# Patient Record
Sex: Female | Born: 2005 | Race: White | Hispanic: No | Marital: Single | Smoking: Never smoker
Health system: Southern US, Community
[De-identification: ages and names within clinical notes are randomized; demographics above are authoritative.]

---
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
## 2023-03-09 IMAGING — CT CT ABD-PELV W/ CM
2 of 4 series · 17 of 46 positions shown, 19 images · IV contrast (omnipaque)
Comparison: None.

CLINICAL DATA: Generalized abdominal pain and nausea.

EXAM:
CT ABDOMEN AND PELVIS WITH CONTRAST
TECHNIQUE: Multidetector CT imaging of the abdomen and pelvis was performed
using the standard protocol following bolus administration of
intravenous contrast.
CONTRAST:  100mL OMNIPAQUE IOHEXOL 300 MG/ML  SOLN

[Series 2: axial st · axial · 1.06mm/px · z∈[+1047,+1517]mm · 14 of 104 slices shown, 16 images]
[im 5/104  soft-tissue]
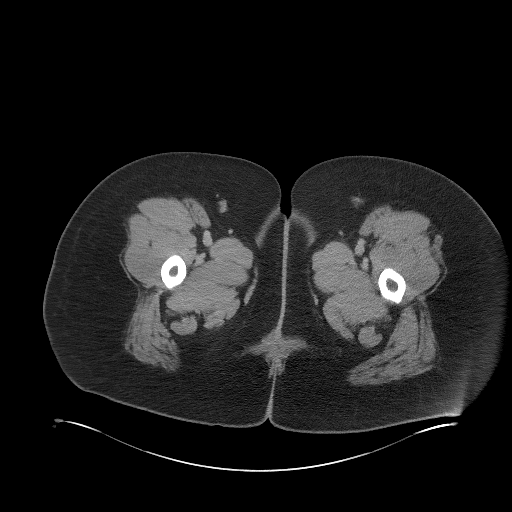
[im 5/104  bone]
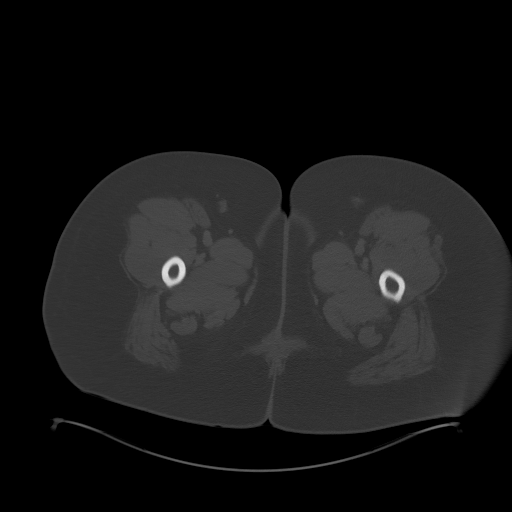
[im 13/104  soft-tissue]
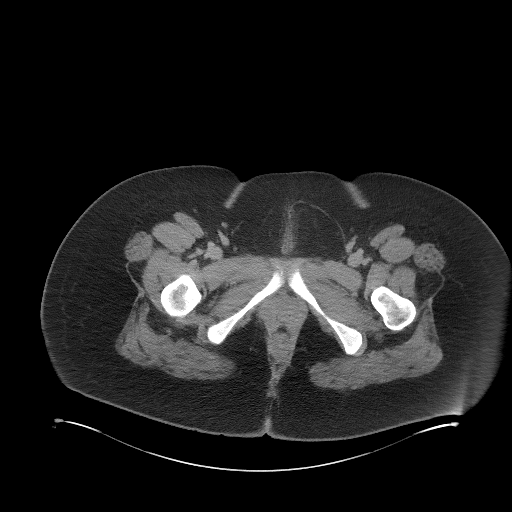
[im 22/104  soft-tissue]
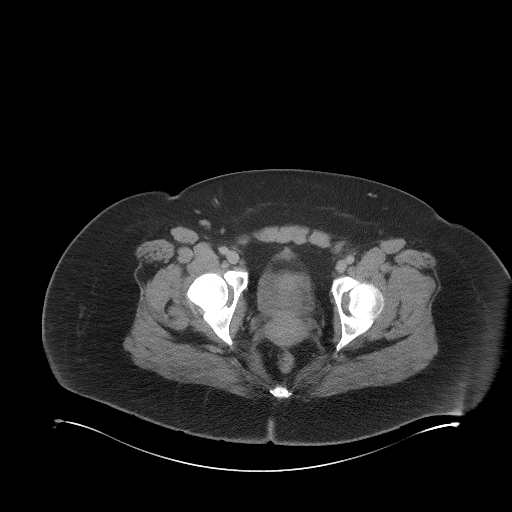
[im 26/104  soft-tissue]
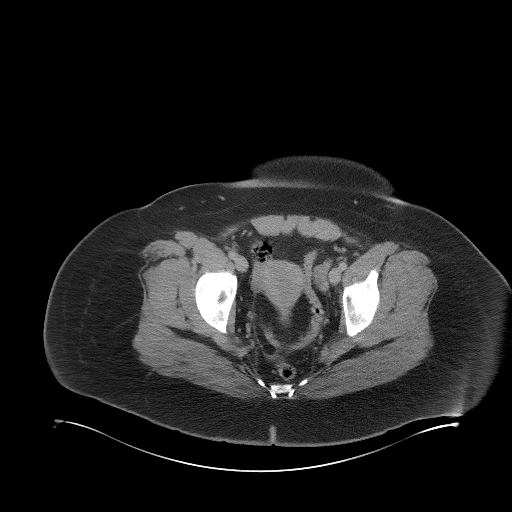
[im 35/104  soft-tissue]
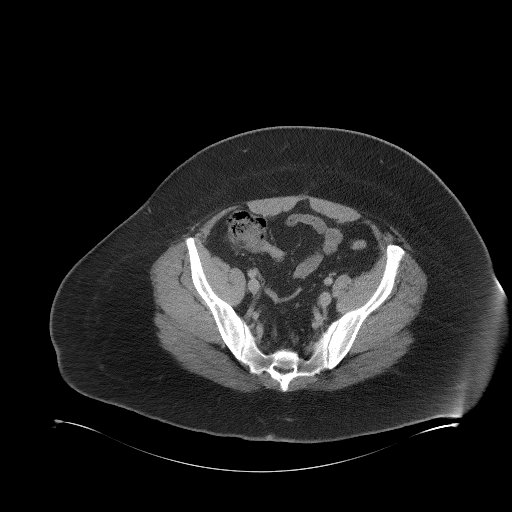
[im 43/104  soft-tissue]
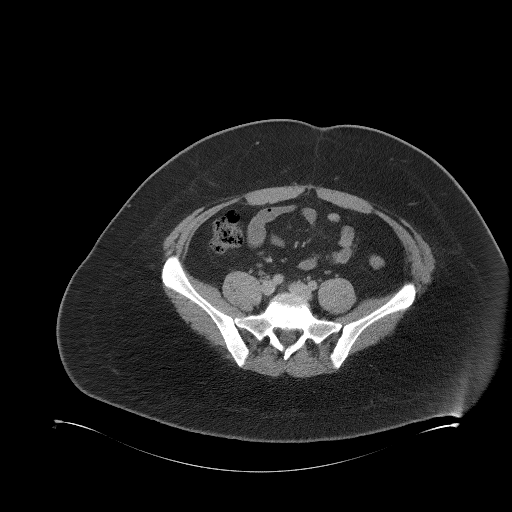
[im 48/104  soft-tissue]
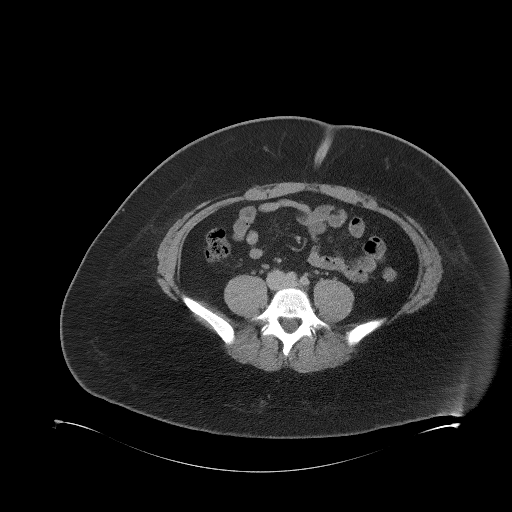
[im 56/104  soft-tissue]
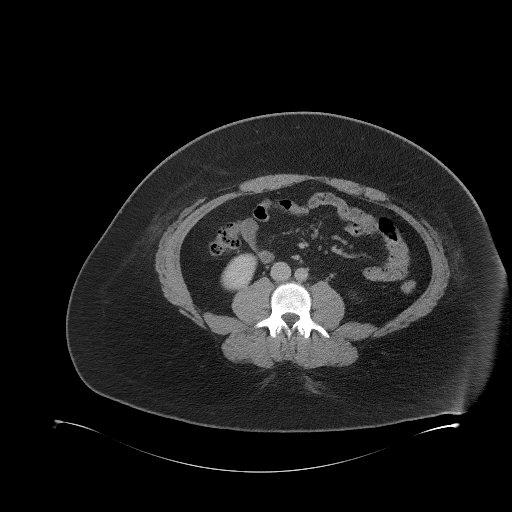
[im 61/104  soft-tissue]
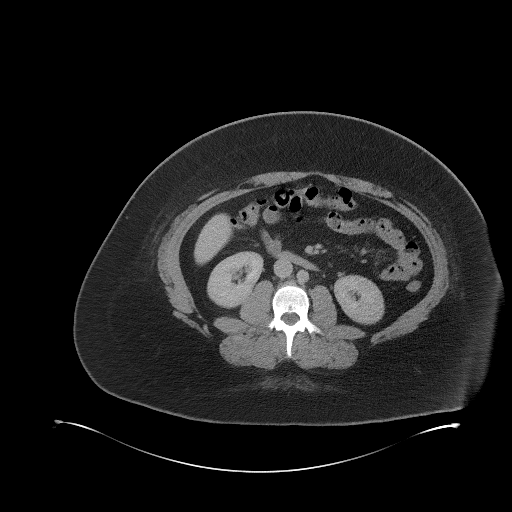
[im 61/104  bone]
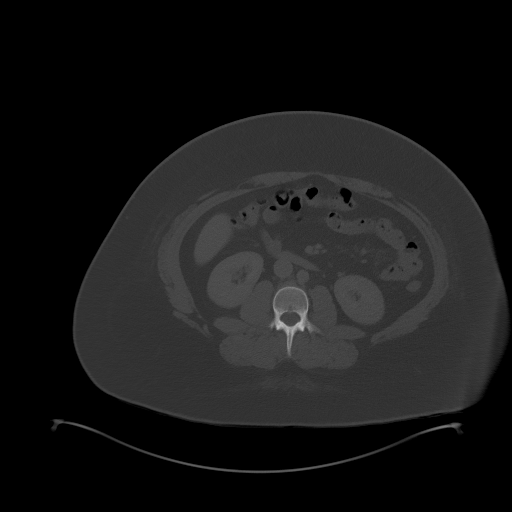
[im 69/104  soft-tissue]
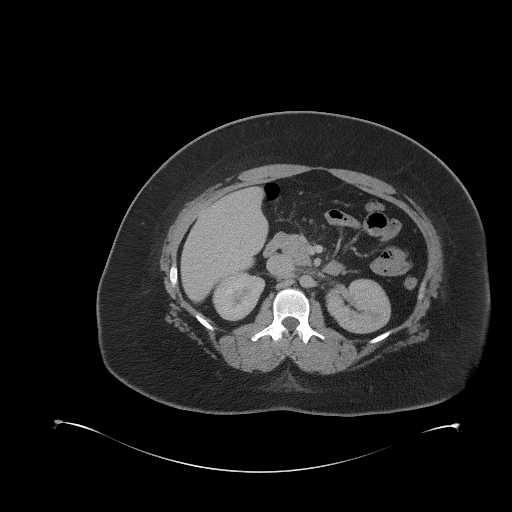
[im 78/104  soft-tissue]
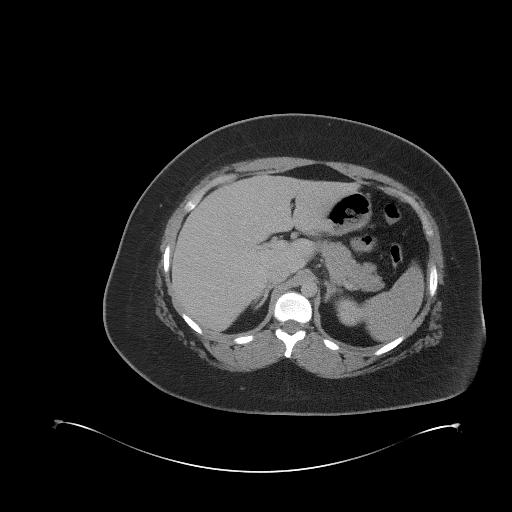
[im 82/104  soft-tissue]
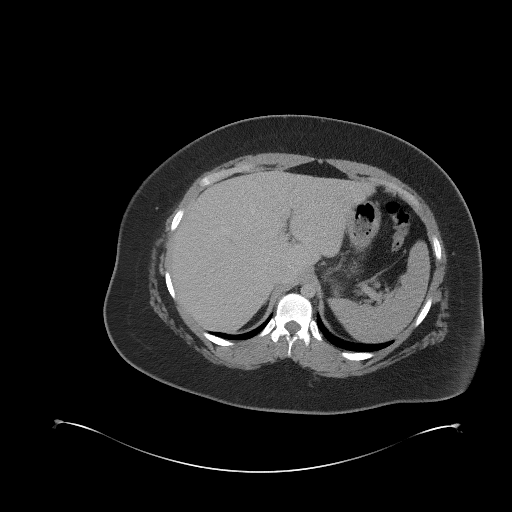
[im 91/104  soft-tissue]
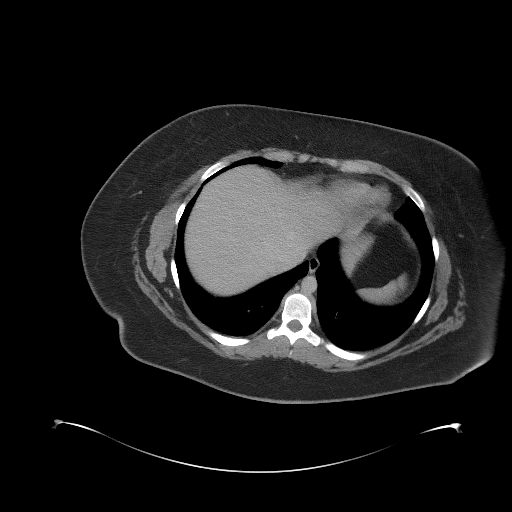
[im 99/104  soft-tissue]
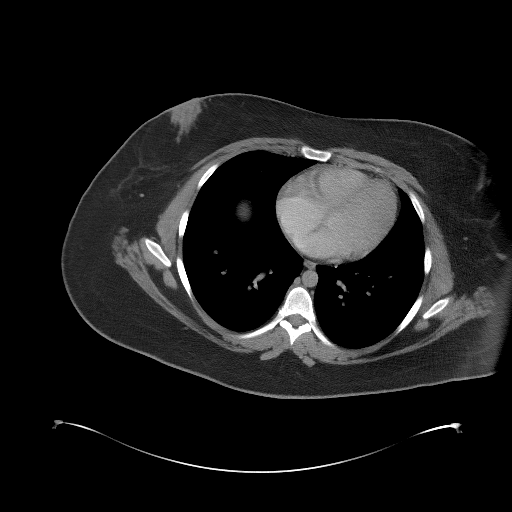

[Series 5: coronal st · coronal · 1.01mm/px · 3 of 133 slices shown]
[im 45/133  soft-tissue]
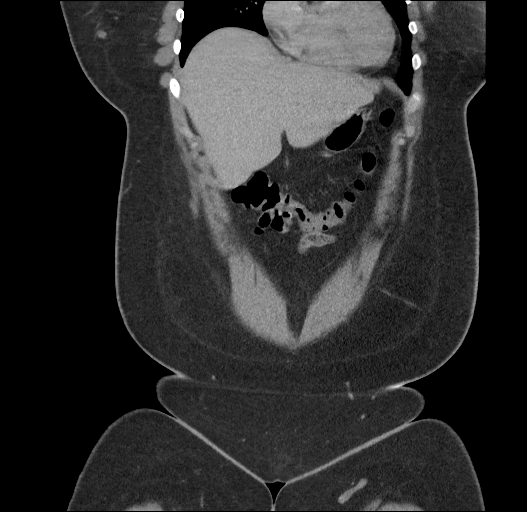
[im 59/133  soft-tissue]
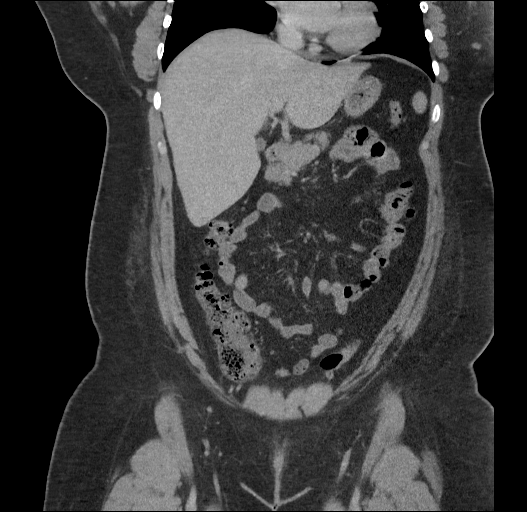
[im 74/133  soft-tissue]
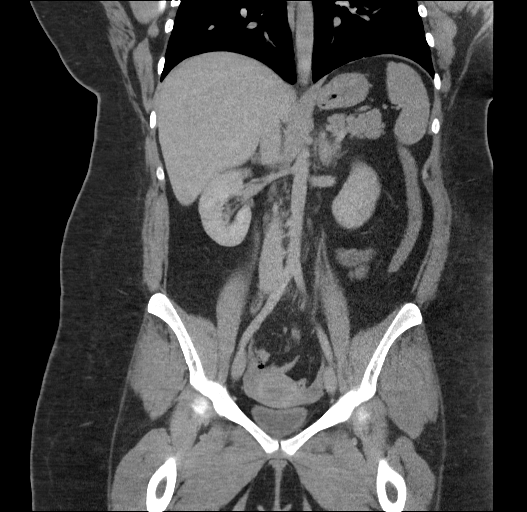

[17 of 46 positions shown; findings below may reference images not displayed]

FINDINGS: Lower chest: No acute abnormality.

Hepatobiliary: No focal liver abnormality is seen. No gallstones,
gallbladder wall thickening, or biliary dilatation.

Pancreas: Unremarkable. No pancreatic ductal dilatation or
surrounding inflammatory changes.

Spleen: Normal in size without focal abnormality.

Adrenals/Urinary Tract: Adrenal glands are unremarkable. Kidneys are
normal, without renal calculi, focal lesion, or hydronephrosis.
Bladder is unremarkable.

Stomach/Bowel: Stomach is within normal limits. Appendix appears
normal. No evidence of bowel wall thickening, distention, or
inflammatory changes.

Vascular/Lymphatic: No significant vascular findings are present. No
enlarged abdominal or pelvic lymph nodes.

Reproductive: Uterus and bilateral adnexa are unremarkable.

Other: No abdominal wall hernia or abnormality. No abdominopelvic
ascites.

Musculoskeletal: No acute or significant osseous findings.
IMPRESSION: No acute intra-abdominal findings.

## 2023-11-07 ENCOUNTER — Other Ambulatory Visit: Payer: Self-pay

## 2023-11-07 ENCOUNTER — Encounter (HOSPITAL_COMMUNITY): Payer: Self-pay | Admitting: *Deleted

## 2023-11-07 ENCOUNTER — Emergency Department (HOSPITAL_COMMUNITY)
Admission: EM | Admit: 2023-11-07 | Discharge: 2023-11-07 | Disposition: A | Discharge status: Home / Self Care | Payer: MEDICAID | Attending: Emergency Medicine | Admitting: Emergency Medicine

## 2023-11-07 DIAGNOSIS — R059 Cough, unspecified: Secondary | ICD-10-CM | POA: Diagnosis present

## 2023-11-07 DIAGNOSIS — J069 Acute upper respiratory infection, unspecified: Secondary | ICD-10-CM | POA: Diagnosis not present

## 2023-11-07 DIAGNOSIS — H6693 Otitis media, unspecified, bilateral: Secondary | ICD-10-CM | POA: Diagnosis not present

## 2023-11-07 MED ORDER — AMOXICILLIN-POT CLAVULANATE 875-125 MG PO TABS: 1.0000 | ORAL_TABLET | Freq: Two times a day (BID) | ORAL | 0 refills | Status: AC

## 2023-11-07 NOTE — ED Provider Notes (Signed)
 [place] EMERGENCY DEPARTMENT AT [facility] Provider Note   CSN: [id number] Arrival date & time: 11/07/23  4098 Patient presents for URI symptoms and bilateral otalgia.  Medical history includes obesity.  For the past 3 days, patient has had cough, congestion, sore throat.  She states that she has been eating and drinking appropriately without any difficulty swallowing.  At home, she has been taking Mucinex and ibuprofen.  Last dose of ibuprofen was this morning.  This morning, she woke up with bilateral ear pain, greater on the right.  For this reason, she presents to the ED.    History  Chief Complaint  Patient presents with   Sore Throat    Diana Newman is a 18 y.o. female.  HPI     Home Medications Prior to Admission medications   Medication Sig Start Date End Date Taking? Authorizing Provider  amoxicillin-clavulanate (AUGMENTIN) 875-125 MG tablet Take 1 tablet by mouth every 12 (twelve) hours. 11/07/23  Yes Gloris Manchester, MD  desogestrel-ethinyl estradiol (APRI) 0.15-30 MG-MCG tablet Take 1 tablet by mouth daily. 06/18/21   Adline Potter, NP  medroxyPROGESTERone (PROVERA) 10 MG tablet Take 1 tablet (10 mg total) by mouth daily. 06/18/21   Adline Potter, NP  metoCLOPramide (REGLAN) 5 MG tablet Take 1 tablet (5 mg total) by mouth every 8 (eight) hours as needed for nausea. 03/03/21   Bing Neighbors, NP  ondansetron (ZOFRAN-ODT) 4 MG disintegrating tablet Take 1 tablet (4 mg total) by mouth every 8 (eight) hours as needed for nausea or vomiting. 07/27/21   Sabas Sous, MD  traMADol (ULTRAM) 50 MG tablet Take 1 tablet (50 mg total) by mouth every 6 (six) hours as needed. 11/10/22   Geoffery Lyons, MD      Allergies    Patient has no known allergies.    Review of Systems   Review of Systems  HENT:  Positive for congestion, ear pain and sore throat.   Respiratory:  Positive for cough.   All other systems reviewed and are negative.   Physical  Exam Updated Vital Signs BP 115/79 (BP Location: Left Wrist)   Pulse 84   Temp 98.1 F (36.7 C) (Oral)   Resp 18   Wt 117.9 kg   LMP 10/17/2023 (Exact Date)   SpO2 99%   BMI 46.06 kg/m  Physical Exam Vitals and nursing note reviewed.  Constitutional:      General: She is not in acute distress.    Appearance: She is well-developed. She is not ill-appearing, toxic-appearing or diaphoretic.  HENT:     Head: Normocephalic and atraumatic.     Right Ear: No drainage or swelling. No middle ear effusion. Tympanic membrane is erythematous.     Left Ear: No drainage or swelling.  No middle ear effusion. Tympanic membrane is erythematous.     Mouth/Throat:     Mouth: Mucous membranes are moist. No oral lesions.     Pharynx: Uvula midline. Uvula swelling present. No pharyngeal swelling or oropharyngeal exudate.     Tonsils: No tonsillar exudate or tonsillar abscesses. 2+ on the right. 2+ on the left.  Eyes:     Conjunctiva/sclera: Conjunctivae normal.  Cardiovascular:     Rate and Rhythm: Normal rate and regular rhythm.     Heart sounds: No murmur heard. Pulmonary:     Effort: Pulmonary effort is normal. No respiratory distress.     Breath sounds: Normal breath sounds. No wheezing, rhonchi or rales.  Abdominal:  General: There is no distension.     Palpations: Abdomen is soft.     Tenderness: There is no abdominal tenderness.  Musculoskeletal:        General: No swelling.     Cervical back: Normal range of motion and neck supple.  Skin:    General: Skin is warm and dry.     Capillary Refill: Capillary refill takes less than 2 seconds.  Neurological:     General: No focal deficit present.     Mental Status: She is alert and oriented to person, place, and time.  Psychiatric:        Mood and Affect: Mood normal.        Behavior: Behavior normal.     ED Results / Procedures / Treatments   Labs (all labs ordered are listed, but only abnormal results are displayed) Labs  Reviewed - No data to display  EKG None  Radiology No results found.  Procedures Procedures    Medications Ordered in ED Medications - No data to display  ED Course/ Medical Decision Making/ A&P                                 Medical Decision Making  Patient presenting with bilateral otalgia.  This is in the setting of 3 days of URI symptoms.  On exam, she is overall well-appearing.  Vital signs on arrival are reassuring.  On inspection of oropharynx, there is no significant erythema or swelling.  Uvula is midline and mildly swollen.  On lung auscultation, no wheezing or rhonchi is appreciated.  She denies any sputum production.  On inspection of TMs, she does have bilateral erythema.  I do not appreciate middle ear effusion.  Patient was advised to continue supportive care at home with over-the-counter Tylenol and ibuprofen.  She was provided with prescription for Augmentin.  She was informed that it would be okay to delay antibiotics for 24 hours to see if her symptoms improve with supportive care alone.  She was discharged in stable condition.        Final Clinical Impression(s) / ED Diagnoses Final diagnoses:  Viral URI with cough  Bilateral otitis media, unspecified otitis media type    Rx / DC Orders ED Discharge Orders          Ordered    amoxicillin-clavulanate (AUGMENTIN) 875-125 MG tablet  Every 12 hours        11/07/23 0744              Gloris Manchester, MD 11/07/23 0745

## 2023-11-07 NOTE — ED Triage Notes (Addendum)
 Here by POV from home for URI/ ILI sx, onset Thursday. Endorses NP cough, congestion, sore throat, HA, low grade fever, bilateral ear pain R>L, nausea and sob. Denies dizziness, vd, syncope. Alert, NAD, calm, interactive. Guarding throat. Tonsillar redness, inflammation, minimal swelling noted. Ibuprofen taken at 0600.

## 2023-11-07 NOTE — Discharge Instructions (Signed)
 A prescription for antibiotics was sent to your pharmacy.  This is for treatment of a middle ear infection.  It would be okay to delay taking this for 24 hours to see if your symptoms improve with supportive care alone.  Supportive care includes good hydration, ibuprofen and Tylenol as needed.  Follow-up with your PCP.  Return to the emergency department for any new or worsening symptoms of concern.

## 2023-11-07 NOTE — ED Notes (Signed)
 EDP at [facility]
